# Patient Record
Sex: Male | Born: 1965 | Race: White | Hispanic: No | Marital: Married | Smoking: Never smoker
Health system: Southern US, Community
[De-identification: ages and names within clinical notes are randomized; demographics above are authoritative.]

## PROBLEM LIST (undated history)

## (undated) DIAGNOSIS — I10 Essential (primary) hypertension: Secondary | ICD-10-CM

## (undated) HISTORY — PX: KNEE ARTHROSCOPY WITH ANTERIOR CRUCIATE LIGAMENT (ACL) REPAIR: SHX5644

---

## 2005-02-21 ENCOUNTER — Ambulatory Visit: Payer: Self-pay | Admitting: General Practice

## 2006-06-02 ENCOUNTER — Ambulatory Visit: Payer: Self-pay | Admitting: Unknown Physician Specialty

## 2006-10-07 ENCOUNTER — Ambulatory Visit: Payer: Self-pay | Admitting: General Practice

## 2006-11-12 ENCOUNTER — Other Ambulatory Visit: Payer: Self-pay

## 2006-11-12 ENCOUNTER — Inpatient Hospital Stay: Payer: Self-pay | Admitting: *Deleted

## 2009-05-17 ENCOUNTER — Emergency Department: Payer: Self-pay | Admitting: Emergency Medicine

## 2009-07-14 ENCOUNTER — Ambulatory Visit: Payer: Self-pay | Admitting: Unknown Physician Specialty

## 2009-09-06 ENCOUNTER — Ambulatory Visit: Payer: Self-pay | Admitting: General Practice

## 2012-03-04 DIAGNOSIS — M7660 Achilles tendinitis, unspecified leg: Secondary | ICD-10-CM | POA: Insufficient documentation

## 2012-03-04 DIAGNOSIS — M9260 Juvenile osteochondrosis of tarsus, unspecified ankle: Secondary | ICD-10-CM | POA: Insufficient documentation

## 2012-05-19 ENCOUNTER — Ambulatory Visit: Payer: Self-pay | Admitting: General Practice

## 2012-05-19 ENCOUNTER — Emergency Department: Payer: Self-pay | Admitting: Emergency Medicine

## 2012-05-19 LAB — COMPREHENSIVE METABOLIC PANEL
Alkaline Phosphatase: 70 U/L (ref 50–136)
Anion Gap: 8 (ref 7–16)
BUN: 14 mg/dL (ref 7–18)
Bilirubin,Total: 0.2 mg/dL (ref 0.2–1.0)
Calcium, Total: 8.8 mg/dL (ref 8.5–10.1)
Chloride: 102 mmol/L (ref 98–107)
Co2: 29 mmol/L (ref 21–32)
Creatinine: 0.96 mg/dL (ref 0.60–1.30)
EGFR (African American): >60
EGFR (Non-African Amer.): >60
Potassium: 3.3 mmol/L — ABNORMAL LOW (ref 3.5–5.1)
SGOT(AST): 24 U/L (ref 15–37)
SGPT (ALT): 32 U/L (ref 12–78)
Sodium: 139 mmol/L (ref 136–145)
Total Protein: 7.3 g/dL (ref 6.4–8.2)

## 2012-05-19 LAB — CBC WITH DIFFERENTIAL/PLATELET
Basophil #: 0.1 10*3/uL (ref 0.0–0.1)
Basophil %: 0.5 %
Eosinophil #: 0.2 10*3/uL (ref 0.0–0.7)
Lymphocyte #: 3 10*3/uL (ref 1.0–3.6)
Lymphocyte %: 26.5 %
MCHC: 33.3 g/dL (ref 32.0–36.0)
MCV: 93 fL (ref 80–100)
Monocyte #: 1.1 x10 3/mm — ABNORMAL HIGH (ref 0.2–1.0)
Neutrophil #: 6.9 10*3/uL — ABNORMAL HIGH (ref 1.4–6.5)
Neutrophil %: 62 %
RBC: 4.2 10*6/uL — ABNORMAL LOW (ref 4.40–5.90)
RDW: 12.4 % (ref 11.5–14.5)

## 2012-05-24 ENCOUNTER — Other Ambulatory Visit: Payer: Self-pay | Admitting: General Practice

## 2012-05-24 LAB — PROTIME-INR
INR: 1
Prothrombin Time: 13.7 secs (ref 11.5–14.7)

## 2012-06-04 ENCOUNTER — Other Ambulatory Visit: Payer: Self-pay | Admitting: General Practice

## 2012-06-04 LAB — PROTIME-INR: INR: 2.5

## 2012-06-15 ENCOUNTER — Encounter: Payer: Self-pay | Admitting: Orthopaedic Surgery

## 2012-07-10 ENCOUNTER — Encounter: Payer: Self-pay | Admitting: Orthopaedic Surgery

## 2012-07-19 ENCOUNTER — Emergency Department: Payer: Self-pay | Admitting: Emergency Medicine

## 2012-08-09 ENCOUNTER — Encounter: Payer: Self-pay | Admitting: Orthopaedic Surgery

## 2012-09-09 ENCOUNTER — Encounter: Payer: Self-pay | Admitting: Orthopaedic Surgery

## 2013-01-07 ENCOUNTER — Ambulatory Visit: Payer: Self-pay | Admitting: Orthopaedic Surgery

## 2013-01-18 ENCOUNTER — Encounter: Payer: Self-pay | Admitting: Orthopaedic Surgery

## 2013-02-07 ENCOUNTER — Encounter: Payer: Self-pay | Admitting: Orthopaedic Surgery

## 2014-08-01 ENCOUNTER — Ambulatory Visit: Payer: Self-pay | Admitting: General Practice

## 2014-08-29 ENCOUNTER — Ambulatory Visit: Payer: Self-pay | Admitting: Gastroenterology

## 2014-12-13 ENCOUNTER — Ambulatory Visit: Admit: 2014-12-13 | Disposition: A | Discharge status: Home / Self Care | Payer: Self-pay | Admitting: General Practice

## 2015-01-02 LAB — SURGICAL PATHOLOGY

## 2015-01-05 ENCOUNTER — Other Ambulatory Visit: Payer: Self-pay | Admitting: General Practice

## 2015-01-05 DIAGNOSIS — M545 Low back pain, unspecified: Secondary | ICD-10-CM

## 2015-01-13 ENCOUNTER — Ambulatory Visit
Admission: RE | Admit: 2015-01-13 | Discharge: 2015-01-13 | Disposition: A | Discharge status: Home / Self Care | Payer: BLUE CROSS/BLUE SHIELD | Source: Ambulatory Visit | Attending: General Practice | Admitting: General Practice

## 2015-01-13 DIAGNOSIS — M545 Low back pain, unspecified: Secondary | ICD-10-CM

## 2015-01-13 DIAGNOSIS — M5127 Other intervertebral disc displacement, lumbosacral region: Secondary | ICD-10-CM | POA: Insufficient documentation

## 2015-01-13 DIAGNOSIS — M5116 Intervertebral disc disorders with radiculopathy, lumbar region: Secondary | ICD-10-CM | POA: Diagnosis not present

## 2015-07-12 ENCOUNTER — Other Ambulatory Visit: Payer: Self-pay | Admitting: Surgery

## 2015-07-12 DIAGNOSIS — M5412 Radiculopathy, cervical region: Secondary | ICD-10-CM

## 2015-07-26 ENCOUNTER — Ambulatory Visit: Payer: BLUE CROSS/BLUE SHIELD

## 2016-01-19 ENCOUNTER — Other Ambulatory Visit: Payer: Self-pay | Admitting: Family Medicine

## 2016-01-19 DIAGNOSIS — R7989 Other specified abnormal findings of blood chemistry: Secondary | ICD-10-CM

## 2016-01-19 DIAGNOSIS — R945 Abnormal results of liver function studies: Secondary | ICD-10-CM

## 2016-01-19 DIAGNOSIS — R1011 Right upper quadrant pain: Secondary | ICD-10-CM

## 2016-01-24 ENCOUNTER — Ambulatory Visit: Payer: BLUE CROSS/BLUE SHIELD

## 2016-01-29 ENCOUNTER — Ambulatory Visit
Admission: RE | Admit: 2016-01-29 | Discharge: 2016-01-29 | Disposition: A | Discharge status: Home / Self Care | Payer: BLUE CROSS/BLUE SHIELD | Source: Ambulatory Visit | Attending: Family Medicine | Admitting: Family Medicine

## 2016-01-29 DIAGNOSIS — R1011 Right upper quadrant pain: Secondary | ICD-10-CM | POA: Insufficient documentation

## 2016-01-29 DIAGNOSIS — R945 Abnormal results of liver function studies: Secondary | ICD-10-CM

## 2016-01-29 DIAGNOSIS — R932 Abnormal findings on diagnostic imaging of liver and biliary tract: Secondary | ICD-10-CM | POA: Diagnosis not present

## 2016-01-29 DIAGNOSIS — R7989 Other specified abnormal findings of blood chemistry: Secondary | ICD-10-CM | POA: Diagnosis not present

## 2017-01-17 IMAGING — US US ABDOMEN LIMITED
1 series · 14 of 25 positions shown · non-contrast
Comparison: Abdominal ultrasound 09/06/2009.

CLINICAL DATA: 50-year-old male with history of right upper
quadrant abdominal pain intermittently for the past 3 months and
elevated liver function tests.

EXAM:
US ABDOMEN LIMITED - RIGHT UPPER QUADRANT

[Series 1: us abdomen limited · 0.22mm/px · 14 of 57 slices shown]
[im 1/57]
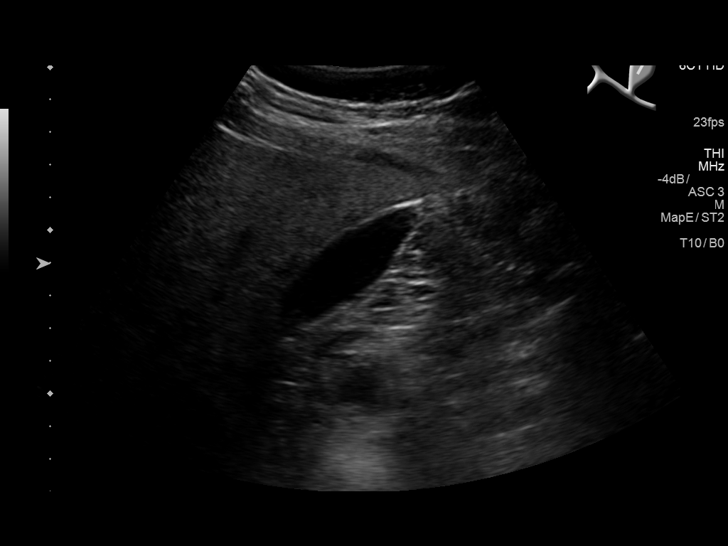
[im 5/57]
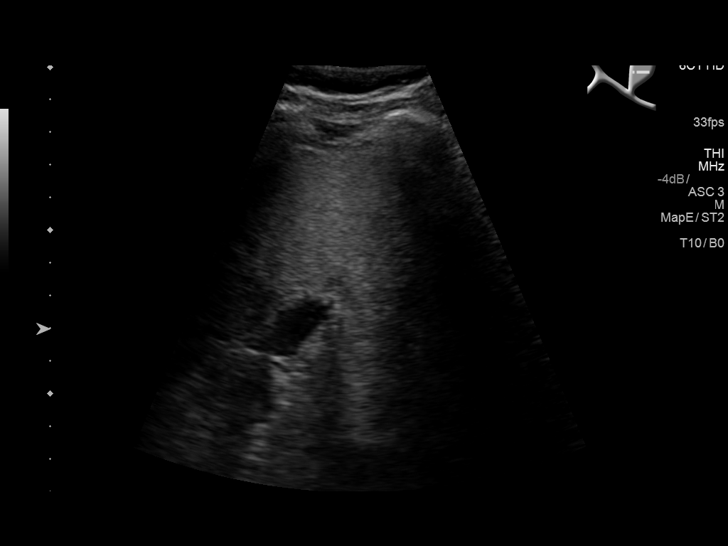
[im 10/57]
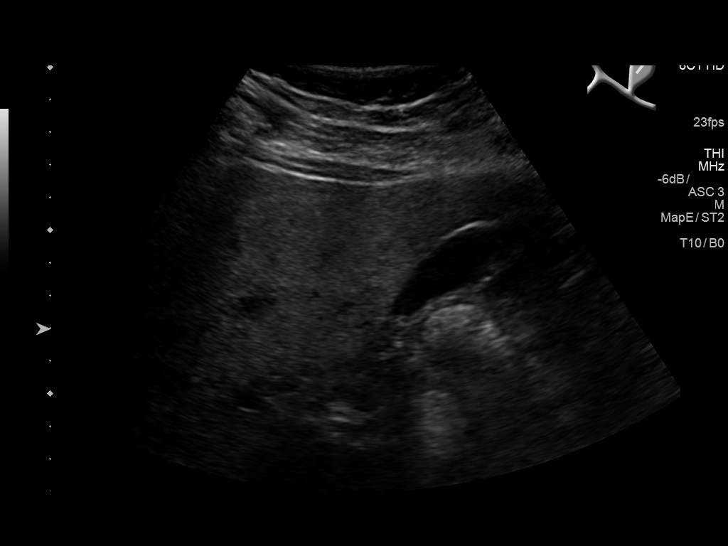
[im 15/57]
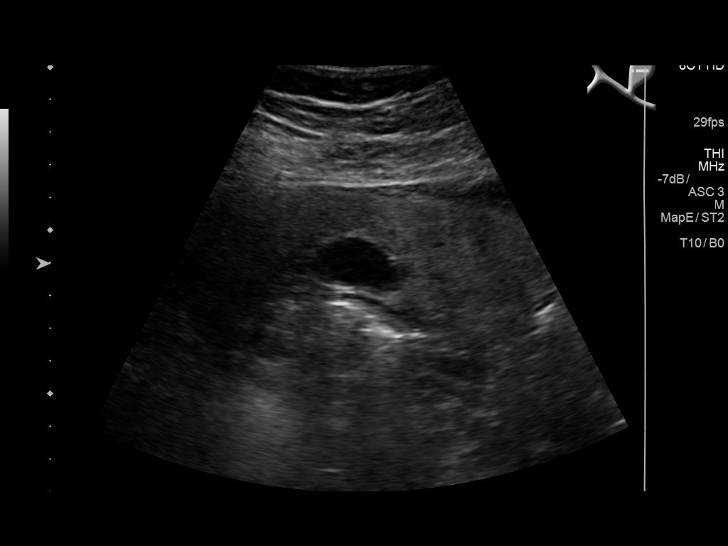
[im 19/57]
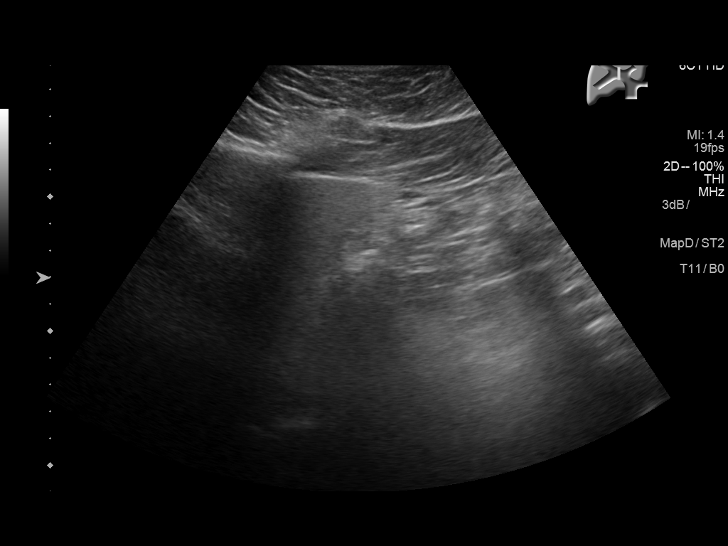
[im 22/57]
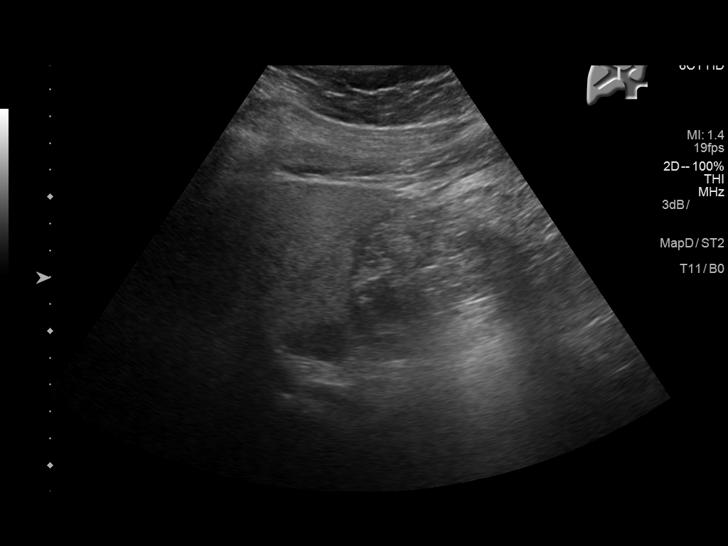
[im 26/57]
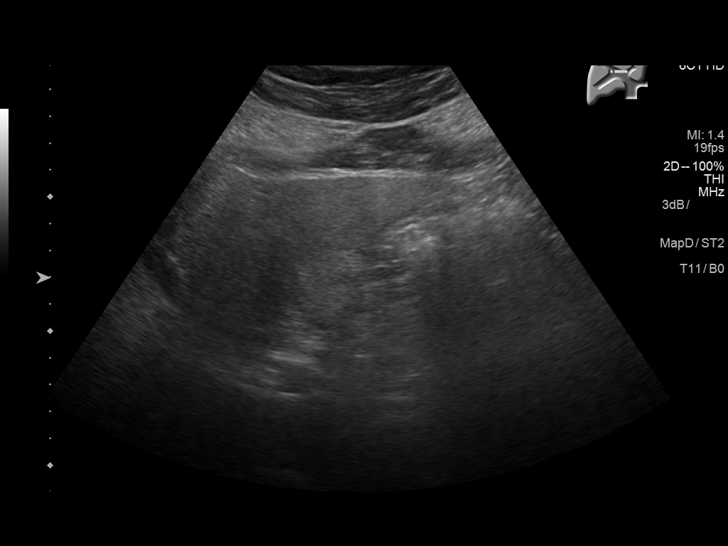
[im 31/57]
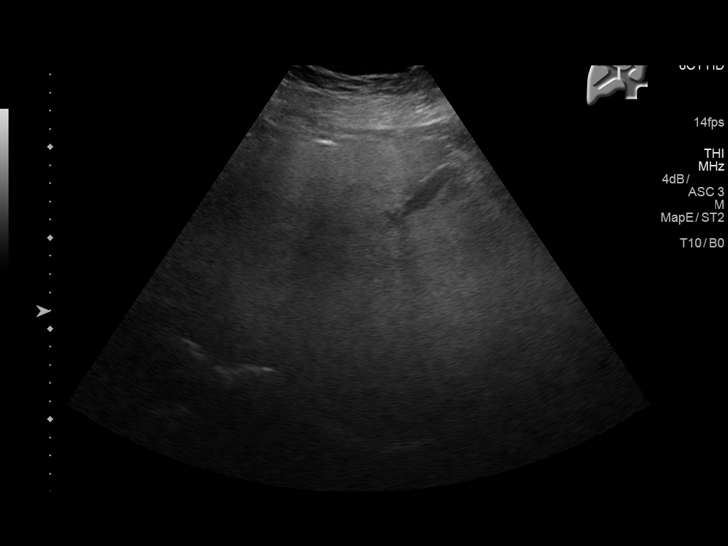
[im 36/57]
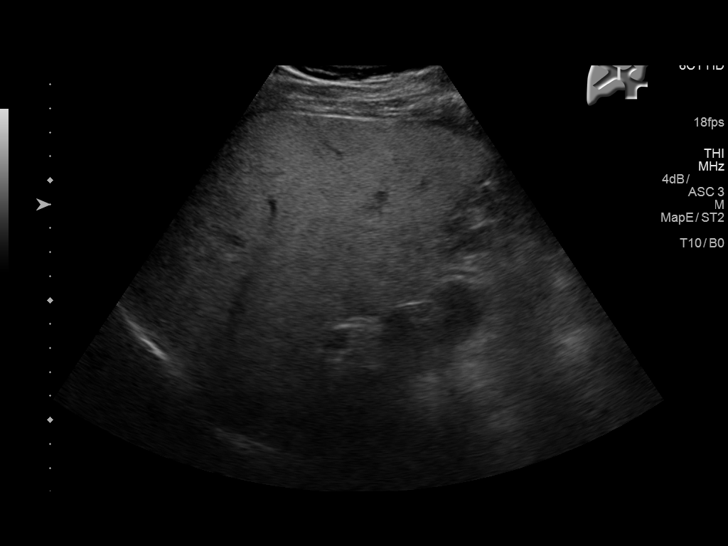
[im 38/57]
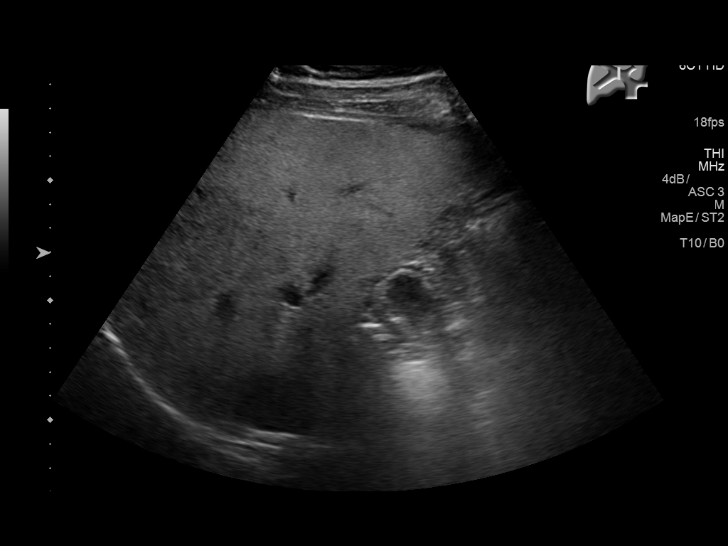
[im 43/57]
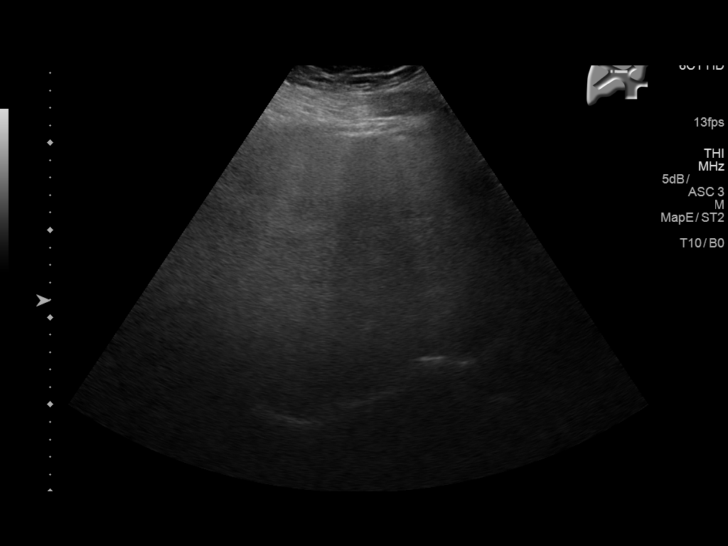
[im 47/57]
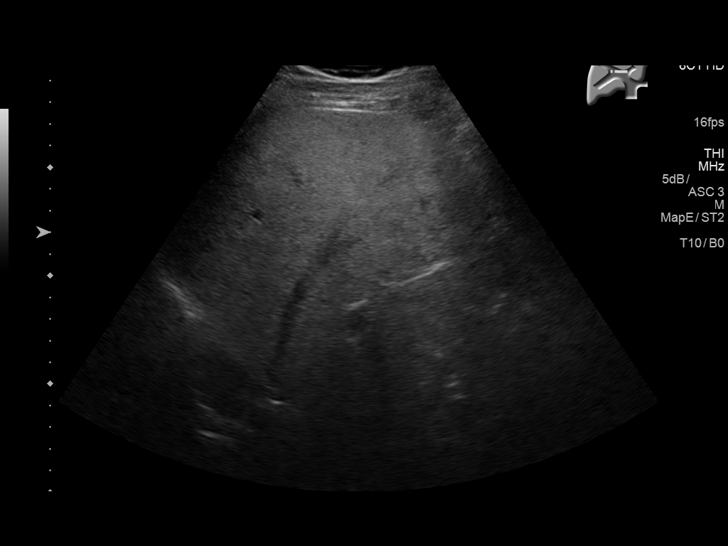
[im 52/57]
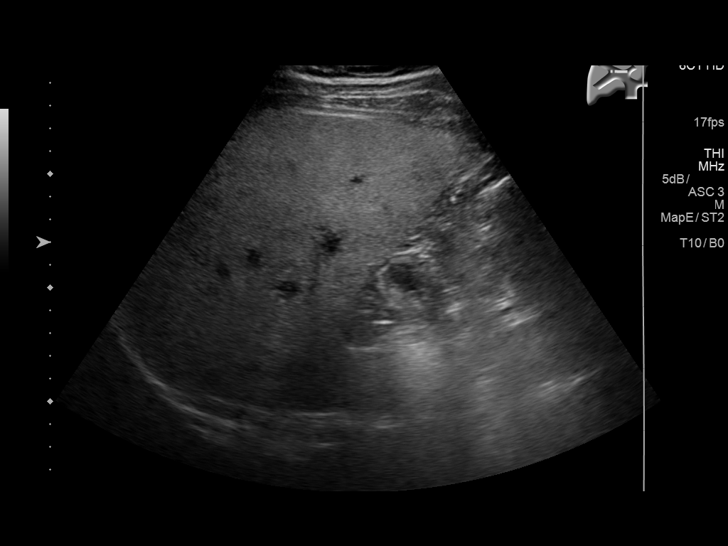
[im 57/57]
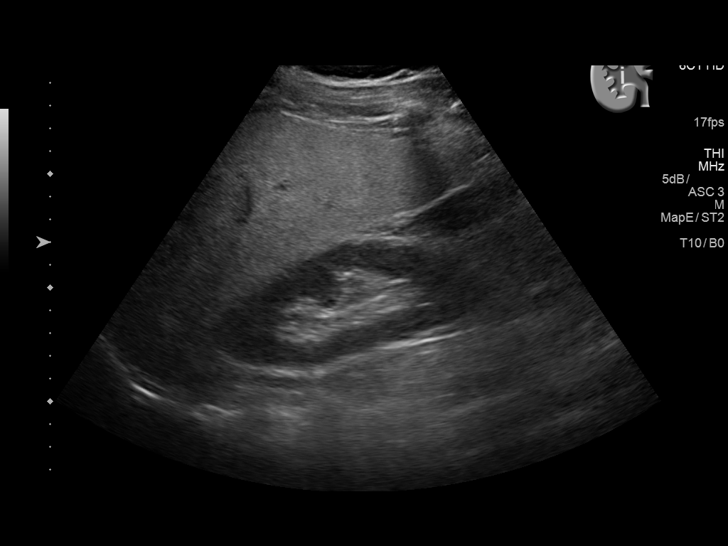

[14 of 25 positions shown; findings below may reference images not displayed]

FINDINGS: Gallbladder:

No gallstones or wall thickening visualized. No sonographic Murphy
sign noted by sonographer.

Common bile duct:

Diameter: 4.4 mm

Liver:

No focal lesion identified. Diffusely increased echogenicity,
suggestive of underlying hepatic steatosis. Normal hepatopetal flow
in the portal vein.
IMPRESSION: 1. No acute findings to account for the patient's symptoms.
Specifically, no evidence of cholelithiasis or acute cholecystitis.
2. Diffusely increased echogenicity in the liver indicative of
hepatic steatosis.

## 2017-06-04 ENCOUNTER — Other Ambulatory Visit: Payer: Self-pay | Admitting: Physician Assistant

## 2017-09-16 ENCOUNTER — Emergency Department
Admission: EM | Admit: 2017-09-16 | Discharge: 2017-09-16 | Disposition: A | Discharge status: Home / Self Care | Payer: BLUE CROSS/BLUE SHIELD | Attending: Emergency Medicine | Admitting: Emergency Medicine

## 2017-09-16 ENCOUNTER — Encounter: Payer: Self-pay | Admitting: Emergency Medicine

## 2017-09-16 DIAGNOSIS — Y828 Other medical devices associated with adverse incidents: Secondary | ICD-10-CM | POA: Insufficient documentation

## 2017-09-16 DIAGNOSIS — T8052XA Anaphylactic reaction due to vaccination, initial encounter: Secondary | ICD-10-CM | POA: Diagnosis not present

## 2017-09-16 DIAGNOSIS — R21 Rash and other nonspecific skin eruption: Secondary | ICD-10-CM | POA: Diagnosis present

## 2017-09-16 DIAGNOSIS — T782XXA Anaphylactic shock, unspecified, initial encounter: Secondary | ICD-10-CM

## 2017-09-16 DIAGNOSIS — I10 Essential (primary) hypertension: Secondary | ICD-10-CM | POA: Diagnosis not present

## 2017-09-16 HISTORY — DX: Essential (primary) hypertension: I10

## 2017-09-16 MED ORDER — FAMOTIDINE IN NACL 20-0.9 MG/50ML-% IV SOLN
20.0000 mg | Freq: Once | INTRAVENOUS | Status: AC
  Administered 2017-09-16: 20 mg via INTRAVENOUS
  Filled 2017-09-16: qty 50

## 2017-09-16 MED ORDER — METHYLPREDNISOLONE SODIUM SUCC 125 MG IJ SOLR
125.0000 mg | Freq: Once | INTRAMUSCULAR | Status: AC
Start: 2017-09-16 — End: 2017-09-16
  Administered 2017-09-16: 125 mg via INTRAVENOUS
  Filled 2017-09-16: qty 2

## 2017-09-16 MED ORDER — PREDNISONE 50 MG PO TABS: 50.0000 mg | ORAL_TABLET | Freq: Every day | ORAL | 0 refills | Status: AC

## 2017-09-16 MED ORDER — SODIUM CHLORIDE 0.9 % IV BOLUS (SEPSIS)
1000.0000 mL | Freq: Once | INTRAVENOUS | Status: AC
  Administered 2017-09-16: 1000 mL via INTRAVENOUS

## 2017-09-16 MED ORDER — EPINEPHRINE 0.3 MG/0.3ML IJ SOAJ
0.3000 mg | Freq: Once | INTRAMUSCULAR | Status: AC
  Administered 2017-09-16: 0.3 mg via INTRAMUSCULAR
  Filled 2017-09-16: qty 0.3

## 2017-09-16 MED ORDER — EPINEPHRINE 0.3 MG/0.3ML IJ SOAJ: 0.3000 mg | Freq: Once | INTRAMUSCULAR | 2 refills | Status: AC

## 2017-09-16 MED ORDER — DIPHENHYDRAMINE HCL 50 MG/ML IJ SOLN
50.0000 mg | Freq: Once | INTRAMUSCULAR | Status: AC
  Administered 2017-09-16: 50 mg via INTRAVENOUS
  Filled 2017-09-16: qty 1

## 2017-09-16 NOTE — ED Notes (Signed)
Pt denies chest pain, shortness of breath, throat swelling, and reports that itching has decreased

## 2017-09-16 NOTE — ED Provider Notes (Signed)
[facility] Emergency Department Provider Note  ____________________________________________   First MD Initiated Contact with Patient 09/16/17 1755     (approximate)  I have reviewed the triage vital signs and the nursing notes.   HISTORY  Chief Complaint Allergic Reaction   HPI Hayden Miller is a 52 y.o. male who self presents to the emergency department with an allergic reaction.  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  Earlier today he had a non-preservative cortisone injection into his right elbow and roughly 1 hour thereafter he began to develop itching rash across his chest his belt line.  He took 50 mg of Benadryl as well as ranitidine at home without improvement.  His symptoms have had insidious onset and have been slowly progressive.  He has had no chest pain or shortness of breath.  No abdominal pain nausea or vomiting.  Nothing seems to make the symptoms better.  Past Medical History:  Diagnosis Date  . Hypertension     There are no active problems to display for this patient.   History reviewed. No pertinent surgical history.  Prior to Admission medications   Medication Sig Start Date End Date Taking? Authorizing Provider  EPINEPHrine 0.3 mg/0.3 mL IJ SOAJ injection Inject 0.3 mLs (0.3 mg total) into the muscle once for 1 dose. 09/16/17 09/16/17  Merrily Brittle, MD  predniSONE (DELTASONE) 50 MG tablet Take 1 tablet (50 mg total) by mouth daily for 4 days. 09/16/17 09/20/17  Merrily Brittle, MD    Allergies Cortizone-10 [hydrocortisone]  No family history on file.  Social History Social History   Tobacco Use  . Smoking status: Never Smoker  . Smokeless tobacco: Never Used  Substance Use Topics  . Alcohol use: No    Frequency: Never  . Drug use: No    Review of Systems Constitutional: No  fever/chills Eyes: No visual changes. ENT: No sore throat. Cardiovascular: Denies chest pain. Respiratory: Denies shortness of breath. Gastrointestinal: No abdominal pain.  No nausea, no vomiting.  No diarrhea.  No constipation. Genitourinary: Negative for dysuria. Musculoskeletal: Negative for back pain. Skin: Positive for rash. Neurological: Negative for headaches, focal weakness or numbness.   ____________________________________________   PHYSICAL EXAM:  VITAL SIGNS: ED Triage Vitals  Enc Vitals Group     BP      Pulse      Resp      Temp      Temp src      SpO2      Weight      Height      Head Circumference      Peak Flow      Pain Score      Pain Loc      Pain Edu?      Excl. in GC?     Constitutional: Alert and oriented x4 well-appearing nontoxic no diaphoresis speaks in full clear sentences Eyes: PERRL EOMI. Head: Atraumatic. Nose: No congestion/rhinnorhea. Mouth/Throat: No trismus Neck: No stridor.   Cardiovascular: Normal rate, regular rhythm. Grossly normal heart sounds.  Good peripheral circulation. Respiratory: Normal respiratory effort.  No retractions. Lungs CTAB and moving good air Gastrointestinal: Soft nontender Musculoskeletal: No lower extremity edema   Neurologic:  Normal speech and language. No gross focal neurologic deficits are appreciated. Skin: Urticaria with wheals and flare across his belt line as well as his chest  and abdomen Psychiatric: Mood and affect are normal. Speech and behavior are normal.    ____________________________________________   DIFFERENTIAL includes but not limited to  Allergic reaction, anaphylaxis ____________________________________________   LABS (all labs ordered are listed, but only abnormal results are displayed)  Labs Reviewed - No data to  display   __________________________________________  EKG   ____________________________________________  RADIOLOGY   ____________________________________________   PROCEDURES  Procedure(s) performed: no  Procedures  Critical Care performed: no  Observation: no ____________________________________________   INITIAL IMPRESSION / ASSESSMENT AND PLAN / ED COURSE  Pertinent labs & imaging results that were available during my care of the patient were reviewed by me and considered in my medical decision making (see chart for details).  The patient has a significant allergic reaction although at this point I do not believe he is truly anaphylactic.  I do lengthy discussion with the patient and family at bedside including his EMT daughter and registered nurse wife regarding epinephrine versus observation.  Given his serious allergic reactions anaphylaxis in the past they have opted for epinephrine which I think is reasonable.  Solu-Medrol, Benadryl, Pepcid and fluids are pending.  After receiving epinephrine the patient's rash resolved.  He was observed for 1 hour in the emergency department and I recommended to the patient that he be observed for a total of 4 hours for biphasic reaction.  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  I think this is actually quite reasonable as they are highly educated family who understands the signs and symptoms of anaphylaxis.  He will be discharged for 4 more days of prednisone as well as 2 EpiPen's.  Strict return precautions have been given and the patient verbalizes understanding and agreement the plan.      ____________________________________________   FINAL CLINICAL IMPRESSION(S) / ED DIAGNOSES  Final diagnoses:  Anaphylaxis, initial encounter      NEW MEDICATIONS STARTED DURING  THIS VISIT:  This SmartLink is deprecated. Use AVSMEDLIST instead to display the medication list for a patient.   Note:  This document was prepared using Dragon voice recognition software and may include unintentional dictation errors.     Merrily Brittleifenbark, Morry Veiga, MD 09/16/17 2033

## 2017-09-16 NOTE — Discharge Instructions (Signed)
These take your steroids as prescribed for the next 4 days along with Benadryl and famotidine around-the-clock.  Make sure you follow-up with your primary care physician this week for allergy testing.  Return to the emergency department for any concerns.  It was a pleasure to take care of you today, and thank you for coming to our emergency department.  If you have any questions or concerns before leaving please ask the nurse to grab me and I'm more than happy to go through your aftercare instructions again.  If you were prescribed any opioid pain medication today such as Norco, Vicodin, Percocet, morphine, hydrocodone, or oxycodone please make sure you do not drive when you are taking this medication as it can alter your ability to drive safely.  If you have any concerns once you are home that you are not improving or are in fact getting worse before you can make it to your follow-up appointment, please do not hesitate to call 911 and come back for further evaluation.  Merrily BrittleNeil [place], MD

## 2017-09-16 NOTE — ED Triage Notes (Signed)
Pt in via POV with complaints of allergic reaction to cortizone shot; pt with hx of anaphylaxis in past, MD advised pt that the reaction was to preservatives in the shot and that this shot did not have any preservatives.  Pt with full body hives, denies any trouble breathing, able to speak in full clear sentences.  EDP to bedside upon arrival to room.

## 2018-01-23 DIAGNOSIS — K219 Gastro-esophageal reflux disease without esophagitis: Secondary | ICD-10-CM | POA: Insufficient documentation

## 2018-01-23 DIAGNOSIS — Z86718 Personal history of other venous thrombosis and embolism: Secondary | ICD-10-CM | POA: Insufficient documentation

## 2018-01-23 DIAGNOSIS — I10 Essential (primary) hypertension: Secondary | ICD-10-CM | POA: Insufficient documentation

## 2019-03-24 DIAGNOSIS — G4733 Obstructive sleep apnea (adult) (pediatric): Secondary | ICD-10-CM | POA: Diagnosis not present

## 2019-04-08 DIAGNOSIS — G2581 Restless legs syndrome: Secondary | ICD-10-CM | POA: Diagnosis not present

## 2019-04-08 DIAGNOSIS — I10 Essential (primary) hypertension: Secondary | ICD-10-CM | POA: Diagnosis not present

## 2019-04-08 DIAGNOSIS — G4733 Obstructive sleep apnea (adult) (pediatric): Secondary | ICD-10-CM | POA: Diagnosis not present

## 2019-04-08 DIAGNOSIS — K219 Gastro-esophageal reflux disease without esophagitis: Secondary | ICD-10-CM | POA: Diagnosis not present

## 2019-04-12 DIAGNOSIS — G4733 Obstructive sleep apnea (adult) (pediatric): Secondary | ICD-10-CM | POA: Diagnosis not present

## 2019-05-13 DIAGNOSIS — G4733 Obstructive sleep apnea (adult) (pediatric): Secondary | ICD-10-CM | POA: Diagnosis not present

## 2019-06-12 DIAGNOSIS — G4733 Obstructive sleep apnea (adult) (pediatric): Secondary | ICD-10-CM | POA: Diagnosis not present

## 2019-07-13 DIAGNOSIS — G4733 Obstructive sleep apnea (adult) (pediatric): Secondary | ICD-10-CM | POA: Diagnosis not present

## 2019-08-12 DIAGNOSIS — G4733 Obstructive sleep apnea (adult) (pediatric): Secondary | ICD-10-CM | POA: Diagnosis not present

## 2019-09-12 DIAGNOSIS — G4733 Obstructive sleep apnea (adult) (pediatric): Secondary | ICD-10-CM | POA: Diagnosis not present

## 2019-09-22 DIAGNOSIS — L821 Other seborrheic keratosis: Secondary | ICD-10-CM | POA: Diagnosis not present

## 2019-09-22 DIAGNOSIS — R202 Paresthesia of skin: Secondary | ICD-10-CM | POA: Diagnosis not present

## 2019-09-22 DIAGNOSIS — D225 Melanocytic nevi of trunk: Secondary | ICD-10-CM | POA: Diagnosis not present

## 2019-09-22 DIAGNOSIS — X32XXXA Exposure to sunlight, initial encounter: Secondary | ICD-10-CM | POA: Diagnosis not present

## 2019-09-22 DIAGNOSIS — D2262 Melanocytic nevi of left upper limb, including shoulder: Secondary | ICD-10-CM | POA: Diagnosis not present

## 2019-09-22 DIAGNOSIS — L570 Actinic keratosis: Secondary | ICD-10-CM | POA: Diagnosis not present

## 2019-09-22 DIAGNOSIS — D2261 Melanocytic nevi of right upper limb, including shoulder: Secondary | ICD-10-CM | POA: Diagnosis not present

## 2019-09-22 DIAGNOSIS — D2272 Melanocytic nevi of left lower limb, including hip: Secondary | ICD-10-CM | POA: Diagnosis not present

## 2019-09-22 DIAGNOSIS — D2271 Melanocytic nevi of right lower limb, including hip: Secondary | ICD-10-CM | POA: Diagnosis not present

## 2019-09-23 DIAGNOSIS — M2012 Hallux valgus (acquired), left foot: Secondary | ICD-10-CM | POA: Diagnosis not present

## 2019-09-23 DIAGNOSIS — M205X2 Other deformities of toe(s) (acquired), left foot: Secondary | ICD-10-CM | POA: Diagnosis not present

## 2019-09-23 DIAGNOSIS — M10072 Idiopathic gout, left ankle and foot: Secondary | ICD-10-CM | POA: Diagnosis not present

## 2019-09-23 DIAGNOSIS — M65872 Other synovitis and tenosynovitis, left ankle and foot: Secondary | ICD-10-CM | POA: Diagnosis not present

## 2019-10-13 DIAGNOSIS — M7051 Other bursitis of knee, right knee: Secondary | ICD-10-CM | POA: Diagnosis not present

## 2019-10-13 DIAGNOSIS — G4733 Obstructive sleep apnea (adult) (pediatric): Secondary | ICD-10-CM | POA: Diagnosis not present

## 2019-11-10 DIAGNOSIS — G4733 Obstructive sleep apnea (adult) (pediatric): Secondary | ICD-10-CM | POA: Diagnosis not present

## 2019-11-26 DIAGNOSIS — G4733 Obstructive sleep apnea (adult) (pediatric): Secondary | ICD-10-CM | POA: Diagnosis not present

## 2019-12-03 ENCOUNTER — Ambulatory Visit: Payer: 59 | Attending: Internal Medicine

## 2019-12-03 DIAGNOSIS — Z23 Encounter for immunization: Secondary | ICD-10-CM

## 2019-12-03 NOTE — Progress Notes (Signed)
   Covid-19 Vaccination Clinic  Name:  Hayden Miller    MRN: [id number] DOB: [birth year]  12/03/2019  Mr. [name] was observed post Covid-19 immunization for 15 minutes without incident. He was provided with Vaccine Information Sheet and instruction to access the V-Safe system.   [name] was instructed to call 911 with any severe reactions post vaccine: Marland Kitchen Difficulty breathing  . Swelling of face and throat  . A fast heartbeat  . A bad rash all over body  . Dizziness and weakness   Immunizations Administered    Name Date Dose VIS Date Route   Pfizer COVID-19 Vaccine 12/03/2019 11:25 AM 0.3 mL 08/20/2019 Intramuscular   Manufacturer: [employer], [employer]   Lot: [id number]   NDC: [address]-3

## 2019-12-11 DIAGNOSIS — G4733 Obstructive sleep apnea (adult) (pediatric): Secondary | ICD-10-CM | POA: Diagnosis not present

## 2019-12-27 DIAGNOSIS — G4733 Obstructive sleep apnea (adult) (pediatric): Secondary | ICD-10-CM | POA: Diagnosis not present

## 2019-12-28 ENCOUNTER — Ambulatory Visit: Payer: 59 | Attending: Internal Medicine

## 2019-12-28 DIAGNOSIS — Z23 Encounter for immunization: Secondary | ICD-10-CM

## 2019-12-28 NOTE — Progress Notes (Signed)
   Covid-19 Vaccination Clinic  Name:  Hayden Miller    MRN: [id number] DOB: 1965/12/15  12/28/2019  Mr. Poss was observed post Covid-19 immunization for 15 minutes without incident. He was provided with Vaccine Information Sheet and instruction to access the V-Safe system.   [name] was instructed to call 911 with any severe reactions post vaccine: Marland Kitchen Difficulty breathing  . Swelling of face and throat  . A fast heartbeat  . A bad rash all over body  . Dizziness and weakness   Immunizations Administered    Name Date Dose VIS Date Route   Pfizer COVID-19 Vaccine 12/28/2019  8:40 AM 0.3 mL 11/03/2018 Intramuscular   Manufacturer: [employer], [employer]   Lot: [id number]   NDC: [address]-7

## 2020-01-10 DIAGNOSIS — G4733 Obstructive sleep apnea (adult) (pediatric): Secondary | ICD-10-CM | POA: Diagnosis not present

## 2020-01-20 DIAGNOSIS — Z125 Encounter for screening for malignant neoplasm of prostate: Secondary | ICD-10-CM | POA: Diagnosis not present

## 2020-01-20 DIAGNOSIS — I10 Essential (primary) hypertension: Secondary | ICD-10-CM | POA: Diagnosis not present

## 2020-01-20 DIAGNOSIS — Z79899 Other long term (current) drug therapy: Secondary | ICD-10-CM | POA: Diagnosis not present

## 2020-01-26 DIAGNOSIS — G4733 Obstructive sleep apnea (adult) (pediatric): Secondary | ICD-10-CM | POA: Diagnosis not present

## 2020-01-27 DIAGNOSIS — I10 Essential (primary) hypertension: Secondary | ICD-10-CM | POA: Diagnosis not present

## 2020-01-27 DIAGNOSIS — G4733 Obstructive sleep apnea (adult) (pediatric): Secondary | ICD-10-CM | POA: Insufficient documentation

## 2020-01-27 DIAGNOSIS — Z79899 Other long term (current) drug therapy: Secondary | ICD-10-CM | POA: Diagnosis not present

## 2020-01-27 DIAGNOSIS — Z0000 Encounter for general adult medical examination without abnormal findings: Secondary | ICD-10-CM | POA: Diagnosis not present

## 2020-05-03 DIAGNOSIS — G4733 Obstructive sleep apnea (adult) (pediatric): Secondary | ICD-10-CM | POA: Diagnosis not present

## 2020-05-10 DIAGNOSIS — B001 Herpesviral vesicular dermatitis: Secondary | ICD-10-CM | POA: Diagnosis not present

## 2020-05-25 DIAGNOSIS — G4733 Obstructive sleep apnea (adult) (pediatric): Secondary | ICD-10-CM | POA: Diagnosis not present

## 2020-06-03 DIAGNOSIS — G4733 Obstructive sleep apnea (adult) (pediatric): Secondary | ICD-10-CM | POA: Diagnosis not present

## 2020-07-03 DIAGNOSIS — G4733 Obstructive sleep apnea (adult) (pediatric): Secondary | ICD-10-CM | POA: Diagnosis not present

## 2020-07-09 ENCOUNTER — Emergency Department
Admission: EM | Admit: 2020-07-09 | Discharge: 2020-07-09 | Disposition: A | Discharge status: Home / Self Care | Payer: 59 | Attending: Emergency Medicine | Admitting: Emergency Medicine

## 2020-07-09 ENCOUNTER — Emergency Department: Payer: 59

## 2020-07-09 ENCOUNTER — Other Ambulatory Visit: Payer: Self-pay

## 2020-07-09 DIAGNOSIS — I10 Essential (primary) hypertension: Secondary | ICD-10-CM | POA: Diagnosis not present

## 2020-07-09 DIAGNOSIS — M436 Torticollis: Secondary | ICD-10-CM | POA: Insufficient documentation

## 2020-07-09 DIAGNOSIS — M542 Cervicalgia: Secondary | ICD-10-CM | POA: Diagnosis not present

## 2020-07-09 MED ORDER — METHOCARBAMOL 500 MG PO TABS: ORAL_TABLET | ORAL | 0 refills | Status: AC

## 2020-07-09 MED ORDER — METHOCARBAMOL 500 MG PO TABS
1000.0000 mg | ORAL_TABLET | Freq: Once | ORAL | Status: AC
  Administered 2020-07-09: 1000 mg via ORAL
  Filled 2020-07-09: qty 2

## 2020-07-09 MED ORDER — KETOROLAC TROMETHAMINE 30 MG/ML IJ SOLN
30.0000 mg | Freq: Once | INTRAMUSCULAR | Status: AC
  Administered 2020-07-09: 30 mg via INTRAMUSCULAR
  Filled 2020-07-09: qty 1

## 2020-07-09 MED ORDER — OXYCODONE-ACETAMINOPHEN 7.5-325 MG PO TABS: 1.0000 | ORAL_TABLET | Freq: Four times a day (QID) | ORAL | 0 refills | Status: AC | PRN

## 2020-07-09 MED ORDER — ETODOLAC 400 MG PO TABS: 400.0000 mg | ORAL_TABLET | Freq: Two times a day (BID) | ORAL | 0 refills | Status: AC

## 2020-07-09 MED ORDER — OXYCODONE-ACETAMINOPHEN 7.5-325 MG PO TABS
1.0000 | ORAL_TABLET | Freq: Once | ORAL | Status: AC
  Administered 2020-07-09: 1 via ORAL
  Filled 2020-07-09: qty 1

## 2020-07-09 NOTE — ED Notes (Signed)
First Nurse Note: Pt to ED via POV c/o neck pain x 1 week. Pt ambulatory and in NAD.

## 2020-07-09 NOTE — ED Notes (Signed)
Pt ambulatory with a steady gait room from lobby.

## 2020-07-09 NOTE — ED Triage Notes (Signed)
Pt states his neck has been sore about 1 week and then when he went to get into bed last night he felt like "someone stabbed him" in his neck- pt states this morning he had difficulty getting out of bed d/t the pain

## 2020-07-09 NOTE — Discharge Instructions (Signed)
Follow-up with your primary care provider if any continued problems or concerns.  Definitely follow-up if not improving so that more imaging can be done such as an MRI.  Take medication only as directed.  Your medicine was sent to [facility].  Do not take methocarbamol and oxycodone if you are planning on driving or operating machinery as it could cause drowsiness and increase your risk for injury.  It is safe to take etodolac if you do need to drive.  Also the methocarbamol is 1 or 2 tablets every 6 hours as needed for muscle spasms.  If you feel like 1 tablet during the day is enough you may want to take 2 tablets at bedtime if you are having any neck spasms.  Use ice or heat to your neck as needed for discomfort.  Return to the emergency department if any severe worsening of your symptoms or any numbness in your arms.

## 2020-07-09 NOTE — ED Provider Notes (Signed)
[facility] Emergency Department Provider Note   ____________________________________________   First MD Initiated Contact with Patient 07/09/20 1223     (approximate)  I have reviewed the triage vital signs and the nursing notes.   HISTORY  Chief Complaint Neck Pain   HPI Hayden Miller is a 54 y.o. male presents to the ED with complaint of neck pain for approximately 1 week.  Patient states that he woke up with neck pain and difficulty moving his neck without feeling like a stabbing sensation.  He states that the left side is worse than the right.  He denies any previous neck injuries.  He denies any paresthesias.  Patient has taken over-the-counter medication without any relief.  This morning was worse when he was getting out of bed due to the pain.  Currently rates his pain as 10/10.      Past Medical History:  Diagnosis Date  . Hypertension     Patient Active Problem List   Diagnosis Date Noted  . OSA on CPAP 01/27/2020  . Benign essential hypertension 01/23/2018  . GERD without esophagitis 01/23/2018  . History of deep venous thrombosis 01/23/2018  . Achilles tendinitis 03/04/2012  . 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 KNEE ARTHROSCOPY WITH ANTERIOR CRUCIATE LIGAMENT (ACL) REPAIR      Prior to Admission medications   Medication Sig Start Date End Date Taking? Authorizing Provider  etodolac (LODINE) 400 MG tablet Take 1 tablet (400 mg total) by mouth 2 (two) times daily. 07/09/20   Tommi Rumps, PA-C  methocarbamol (ROBAXIN) 500 MG tablet 1-2 tablets every 6 hours as needed muscle spasms 07/09/20   Tommi Rumps, PA-C  oxyCODONE-acetaminophen (PERCOCET) 7.5-325 MG tablet Take 1 tablet by mouth every 6 (six) hours as needed for moderate pain or severe pain. 07/09/20 07/09/21  Tommi Rumps, PA-C    Allergies Cortizone-10 [hydrocortisone] and Codeine  No family history on  file.  Social History Social History   Tobacco Use  . Smoking status: Never Smoker  . Smokeless tobacco: Never Used  Vaping Use  . Vaping Use: Never used  Substance Use Topics  . Alcohol use: No  . Drug use: No    Review of Systems Constitutional: No fever/chills Eyes: No visual changes. ENT: No sore throat. Cardiovascular: Denies chest pain. Respiratory: Denies shortness of breath. Gastrointestinal: No abdominal pain.  No nausea, no vomiting.  No diarrhea.   Musculoskeletal: Positive cervical pain. Skin: Negative for rash. Neurological: Negative for headaches, focal weakness or numbness. ____________________________________________   PHYSICAL EXAM:  VITAL SIGNS: ED Triage Vitals  Enc Vitals Group     BP 07/09/20 1209 118/73     Pulse Rate 07/09/20 1209 72     Resp 07/09/20 1209 18     Temp 07/09/20 1209 98.3 F (36.8 C)     Temp Source 07/09/20 1209 Oral     SpO2 07/09/20 1209 98 %     Weight 07/09/20 1208 240 lb (108.9 kg)     Height 07/09/20 1208 6\' 1"  (1.854 m)     Head Circumference --      Peak Flow --      Pain Score 07/09/20 1207 10     Pain Loc --      Pain Edu? --      Excl. in GC? --     Constitutional: Alert and oriented. Well appearing and in no acute distress. Eyes: Conjunctivae  are normal. PERRL. EOMI. Head: Atraumatic. Nose: No congestion/rhinnorhea. Mouth/Throat: Mucous membranes are moist.  Neck: No stridor.  There was tenderness is noted on palpation of cervical spine posteriorly.  The left side with decreased range of motion secondary to pain.  Patient has no pain with touching his chin to his right shoulder.  No point tenderness noted on palpation of the trapezius muscles bilaterally.  Good muscle strength bilaterally.  No skin discoloration, erythema or warmth is noted. Cardiovascular: Normal rate, regular rhythm. Grossly normal heart sounds.  Good peripheral circulation. Respiratory: Normal respiratory effort.  No retractions. Lungs  CTAB. Gastrointestinal: Soft and nontender. No distention. Musculoskeletal: Moves upper and lower extremities without any difficulty.  Good muscle strength bilaterally. Neurologic:  Normal speech and language.  Reflexes 2+ bilaterally.  No gross focal neurologic deficits are appreciated. No gait instability. Skin:  Skin is warm, dry and intact. No rash noted. Psychiatric: Mood and affect are normal. Speech and behavior are normal.  ____________________________________________   LABS (all labs ordered are listed, but only abnormal results are displayed)  Labs Reviewed - No data to display  RADIOLOGY I, Tommi Rumps, personally viewed and evaluated these images (plain radiographs) as part of my medical decision making, as well as reviewing the written report by the radiologist.   Official radiology report(s): DG Cervical Spine 2-3 Views  Result Date: 07/09/2020 CLINICAL DATA:  Neck pain for about 1 week. EXAM: CERVICAL SPINE - 2-3 VIEW COMPARISON:  None. FINDINGS: There is no evidence of cervical spine fracture or prevertebral soft tissue swelling. Alignment is normal. No other significant bone abnormalities are identified. IMPRESSION: Negative cervical spine radiographs. Electronically Signed   By: Romona Curls M.D.   On: 07/09/2020 14:08    ____________________________________________   PROCEDURES  Procedure(s) performed (including Critical Care):  Procedures   ____________________________________________   INITIAL IMPRESSION / ASSESSMENT AND PLAN / ED COURSE  As part of my medical decision making, I reviewed the following data within the electronic MEDICAL RECORD NUMBER Notes from prior ED visits and [place] Controlled Substance Database  54 year old male presents to the ED with complaint of cervical pain for 1 week without history of injury.  Patient initially woke up with pain to his neck and decreased range of motion.  Patient has tried over-the-counter medications without  any relief.  There is no paresthesias or decreased muscle strength.  Range of motion to the left is less than range of motion to the right.  Patient was given Toradol 30 mg IM, methocarbamol 1000 mg p.o. and Percocet 7.5 mg prior to x-rays.  Cervical spine x-rays were negative for any acute changes.  Patient's range of motion was improved prior to discharge.  We discussed continuing with the same medications and decreasing the methocarbamol to 1 or 2 every 6 hours as needed for muscle spasms.  He is aware that he cannot drive while taking methocarbamol and oxycodone as it could cause drowsiness and increase his risk for injury.  He is encouraged to use ice or heat to his neck and to follow-up with his PCP if any continued problems or not improving.  We discussed future imaging with MRI if he is not getting any relief with these medications at home.  ____________________________________________   FINAL CLINICAL IMPRESSION(S) / ED DIAGNOSES  Final diagnoses:  Torticollis, acute     ED Discharge Orders         Ordered    methocarbamol (ROBAXIN) 500 MG tablet  07/09/20 1435    etodolac (LODINE) 400 MG tablet  2 times daily        07/09/20 1435    oxyCODONE-acetaminophen (PERCOCET) 7.5-325 MG tablet  Every 6 hours PRN        07/09/20 1435          *Please note:  Hayden Miller was evaluated in Emergency Department on 07/09/2020 for the symptoms described in the history of present illness. He was evaluated in the context of the global COVID-19 pandemic, which necessitated consideration that the patient might be at risk for infection with the SARS-CoV-2 virus that causes COVID-19. Institutional protocols and algorithms that pertain to the evaluation of patients at risk for COVID-19 are in a state of rapid change based on information released by regulatory bodies including the CDC and federal and state organizations. These policies and algorithms were followed during the patient's care in  the ED.  Some ED evaluations and interventions may be delayed as a result of limited staffing during and the pandemic.*   Note:  This document was prepared using Dragon voice recognition software and may include unintentional dictation errors.    Tommi Rumps, PA-C 07/09/20 1512    Minna Antis, MD 07/14/20 [phone]

## 2020-07-11 DIAGNOSIS — M542 Cervicalgia: Secondary | ICD-10-CM | POA: Diagnosis not present

## 2020-07-24 DIAGNOSIS — S62336A Displaced fracture of neck of fifth metacarpal bone, right hand, initial encounter for closed fracture: Secondary | ICD-10-CM | POA: Diagnosis not present

## 2020-07-31 DIAGNOSIS — S62336D Displaced fracture of neck of fifth metacarpal bone, right hand, subsequent encounter for fracture with routine healing: Secondary | ICD-10-CM | POA: Diagnosis not present

## 2020-08-15 DIAGNOSIS — S62336D Displaced fracture of neck of fifth metacarpal bone, right hand, subsequent encounter for fracture with routine healing: Secondary | ICD-10-CM | POA: Diagnosis not present

## 2020-08-24 DIAGNOSIS — S62336D Displaced fracture of neck of fifth metacarpal bone, right hand, subsequent encounter for fracture with routine healing: Secondary | ICD-10-CM | POA: Diagnosis not present

## 2020-09-05 DIAGNOSIS — S62336A Displaced fracture of neck of fifth metacarpal bone, right hand, initial encounter for closed fracture: Secondary | ICD-10-CM | POA: Diagnosis not present

## 2020-09-05 DIAGNOSIS — S62336D Displaced fracture of neck of fifth metacarpal bone, right hand, subsequent encounter for fracture with routine healing: Secondary | ICD-10-CM | POA: Diagnosis not present

## 2020-10-05 DIAGNOSIS — G4733 Obstructive sleep apnea (adult) (pediatric): Secondary | ICD-10-CM | POA: Diagnosis not present

## 2020-11-22 DIAGNOSIS — M79672 Pain in left foot: Secondary | ICD-10-CM | POA: Diagnosis not present

## 2020-11-22 DIAGNOSIS — G629 Polyneuropathy, unspecified: Secondary | ICD-10-CM | POA: Diagnosis not present

## 2020-11-22 DIAGNOSIS — M9260 Juvenile osteochondrosis of tarsus, unspecified ankle: Secondary | ICD-10-CM | POA: Diagnosis not present

## 2020-11-22 DIAGNOSIS — M7662 Achilles tendinitis, left leg: Secondary | ICD-10-CM | POA: Diagnosis not present

## 2021-01-26 DIAGNOSIS — Z79899 Other long term (current) drug therapy: Secondary | ICD-10-CM | POA: Diagnosis not present

## 2021-01-26 DIAGNOSIS — Z125 Encounter for screening for malignant neoplasm of prostate: Secondary | ICD-10-CM | POA: Diagnosis not present

## 2021-01-26 DIAGNOSIS — I10 Essential (primary) hypertension: Secondary | ICD-10-CM | POA: Diagnosis not present

## 2021-02-02 DIAGNOSIS — Z1331 Encounter for screening for depression: Secondary | ICD-10-CM | POA: Diagnosis not present

## 2021-02-02 DIAGNOSIS — I10 Essential (primary) hypertension: Secondary | ICD-10-CM | POA: Diagnosis not present

## 2021-02-02 DIAGNOSIS — Z0000 Encounter for general adult medical examination without abnormal findings: Secondary | ICD-10-CM | POA: Diagnosis not present

## 2021-02-02 DIAGNOSIS — G4733 Obstructive sleep apnea (adult) (pediatric): Secondary | ICD-10-CM | POA: Diagnosis not present

## 2021-04-03 DIAGNOSIS — G5793 Unspecified mononeuropathy of bilateral lower limbs: Secondary | ICD-10-CM | POA: Diagnosis not present

## 2021-04-03 DIAGNOSIS — M79672 Pain in left foot: Secondary | ICD-10-CM | POA: Diagnosis not present

## 2021-04-03 DIAGNOSIS — Z8739 Personal history of other diseases of the musculoskeletal system and connective tissue: Secondary | ICD-10-CM | POA: Diagnosis not present

## 2021-07-05 DIAGNOSIS — H1032 Unspecified acute conjunctivitis, left eye: Secondary | ICD-10-CM | POA: Diagnosis not present

## 2021-09-20 DIAGNOSIS — M1711 Unilateral primary osteoarthritis, right knee: Secondary | ICD-10-CM | POA: Diagnosis not present

## 2021-09-21 DIAGNOSIS — D2272 Melanocytic nevi of left lower limb, including hip: Secondary | ICD-10-CM | POA: Diagnosis not present

## 2021-09-21 DIAGNOSIS — D2262 Melanocytic nevi of left upper limb, including shoulder: Secondary | ICD-10-CM | POA: Diagnosis not present

## 2021-09-21 DIAGNOSIS — L570 Actinic keratosis: Secondary | ICD-10-CM | POA: Diagnosis not present

## 2021-09-21 DIAGNOSIS — D225 Melanocytic nevi of trunk: Secondary | ICD-10-CM | POA: Diagnosis not present

## 2021-09-21 DIAGNOSIS — D2271 Melanocytic nevi of right lower limb, including hip: Secondary | ICD-10-CM | POA: Diagnosis not present

## 2021-09-21 DIAGNOSIS — L821 Other seborrheic keratosis: Secondary | ICD-10-CM | POA: Diagnosis not present

## 2021-09-21 DIAGNOSIS — X32XXXA Exposure to sunlight, initial encounter: Secondary | ICD-10-CM | POA: Diagnosis not present

## 2021-09-21 DIAGNOSIS — D2261 Melanocytic nevi of right upper limb, including shoulder: Secondary | ICD-10-CM | POA: Diagnosis not present

## 2021-09-27 DIAGNOSIS — G4733 Obstructive sleep apnea (adult) (pediatric): Secondary | ICD-10-CM | POA: Diagnosis not present

## 2021-10-08 DIAGNOSIS — M1711 Unilateral primary osteoarthritis, right knee: Secondary | ICD-10-CM | POA: Diagnosis not present

## 2021-10-28 DIAGNOSIS — G4733 Obstructive sleep apnea (adult) (pediatric): Secondary | ICD-10-CM | POA: Diagnosis not present

## 2021-11-25 DIAGNOSIS — G4733 Obstructive sleep apnea (adult) (pediatric): Secondary | ICD-10-CM | POA: Diagnosis not present

## 2021-12-28 DIAGNOSIS — G4733 Obstructive sleep apnea (adult) (pediatric): Secondary | ICD-10-CM | POA: Diagnosis not present

## 2022-01-29 DIAGNOSIS — R5383 Other fatigue: Secondary | ICD-10-CM | POA: Diagnosis not present

## 2022-01-29 DIAGNOSIS — Z125 Encounter for screening for malignant neoplasm of prostate: Secondary | ICD-10-CM | POA: Diagnosis not present

## 2022-01-29 DIAGNOSIS — I10 Essential (primary) hypertension: Secondary | ICD-10-CM | POA: Diagnosis not present

## 2022-01-29 DIAGNOSIS — Z79899 Other long term (current) drug therapy: Secondary | ICD-10-CM | POA: Diagnosis not present

## 2022-02-05 DIAGNOSIS — Z1331 Encounter for screening for depression: Secondary | ICD-10-CM | POA: Diagnosis not present

## 2022-02-05 DIAGNOSIS — Z0000 Encounter for general adult medical examination without abnormal findings: Secondary | ICD-10-CM | POA: Diagnosis not present

## 2022-02-05 DIAGNOSIS — G4733 Obstructive sleep apnea (adult) (pediatric): Secondary | ICD-10-CM | POA: Diagnosis not present

## 2022-02-05 DIAGNOSIS — Z79899 Other long term (current) drug therapy: Secondary | ICD-10-CM | POA: Diagnosis not present

## 2022-02-05 DIAGNOSIS — I10 Essential (primary) hypertension: Secondary | ICD-10-CM | POA: Diagnosis not present

## 2022-02-05 DIAGNOSIS — Z125 Encounter for screening for malignant neoplasm of prostate: Secondary | ICD-10-CM | POA: Diagnosis not present

## 2022-02-05 DIAGNOSIS — Z9989 Dependence on other enabling machines and devices: Secondary | ICD-10-CM | POA: Diagnosis not present

## 2022-05-22 DIAGNOSIS — G4733 Obstructive sleep apnea (adult) (pediatric): Secondary | ICD-10-CM | POA: Diagnosis not present

## 2022-06-21 DIAGNOSIS — G4733 Obstructive sleep apnea (adult) (pediatric): Secondary | ICD-10-CM | POA: Diagnosis not present

## 2022-06-28 DIAGNOSIS — L03032 Cellulitis of left toe: Secondary | ICD-10-CM | POA: Diagnosis not present

## 2022-07-22 DIAGNOSIS — G4733 Obstructive sleep apnea (adult) (pediatric): Secondary | ICD-10-CM | POA: Diagnosis not present

## 2022-08-23 IMAGING — CR DG CERVICAL SPINE 2 OR 3 VIEWS
1 series · 3 of 3 positions shown · non-contrast
Comparison: None.

CLINICAL DATA: Neck pain for about 1 week.

EXAM:
CERVICAL SPINE - 2-3 VIEW

[Series 1: dg cervical spine 2 or 3 views · 0.14mm/px · 3 of 3 slices shown]
[im 1/3]
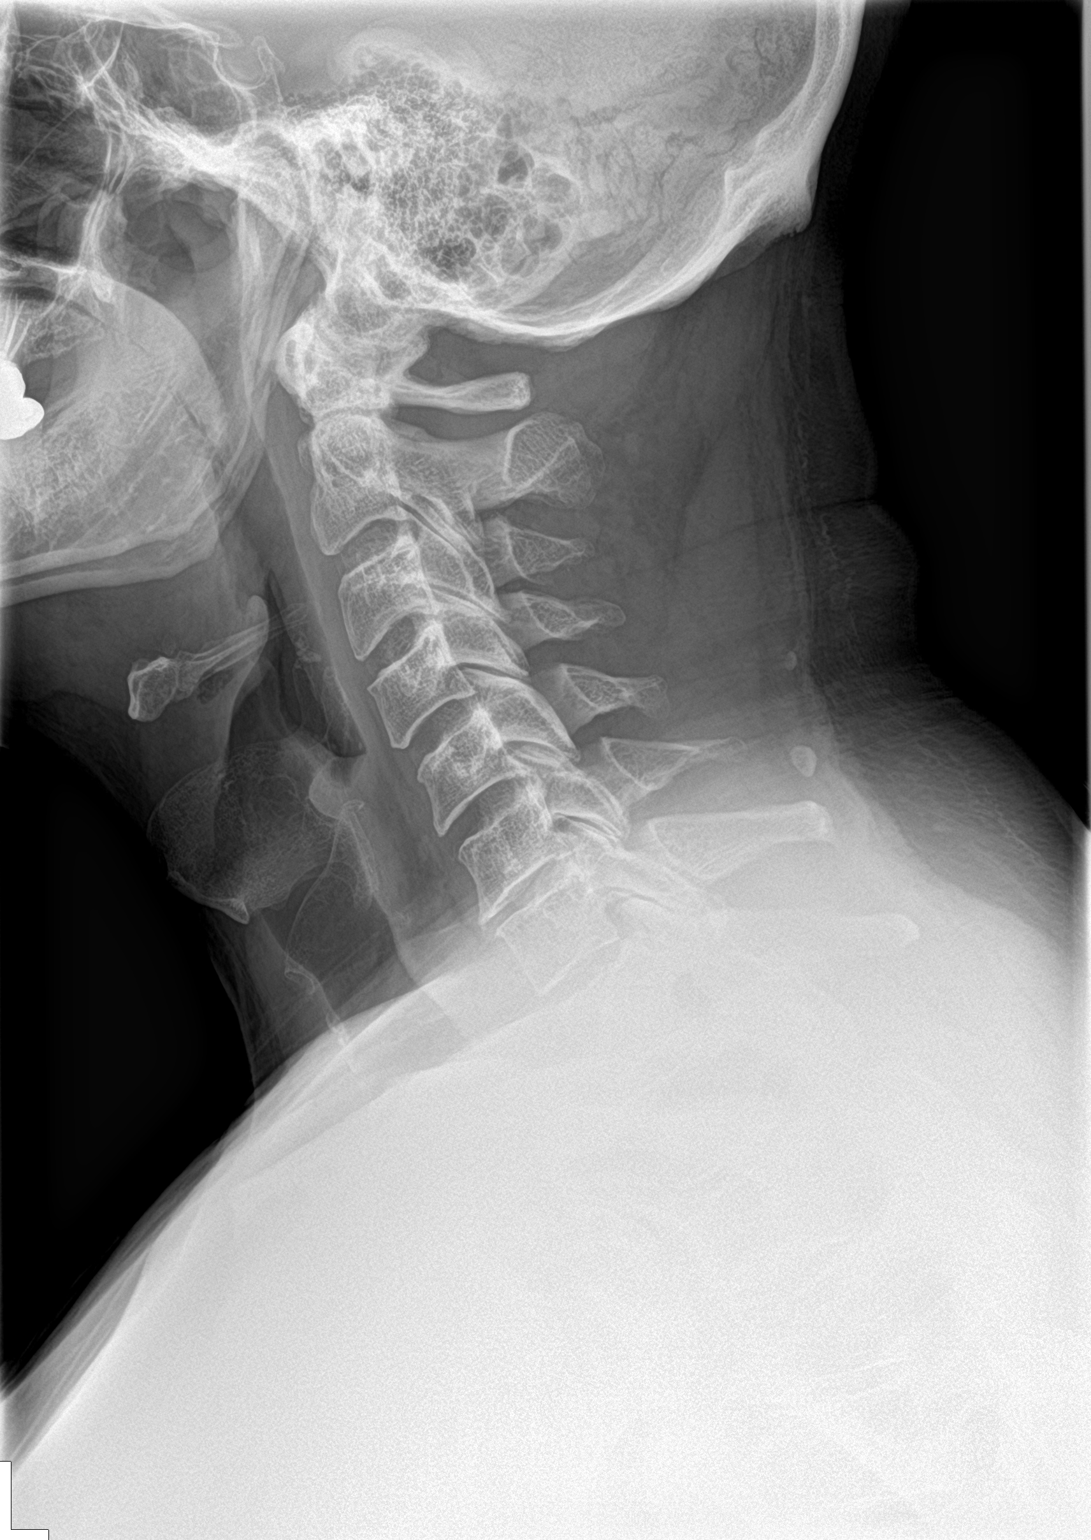
[im 2/3]
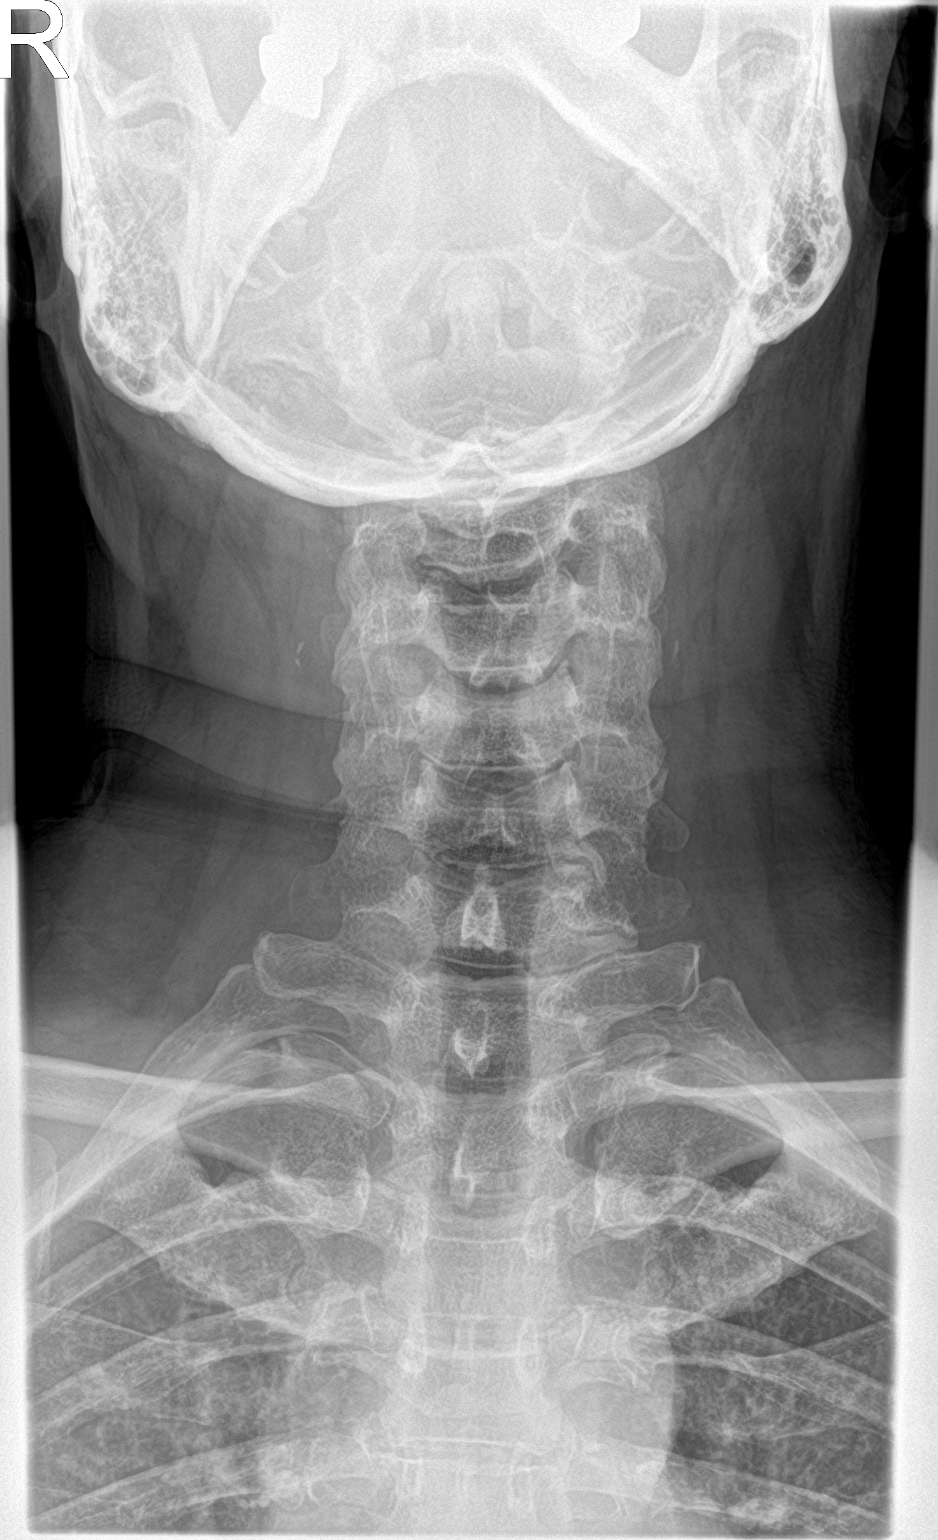
[im 3/3]
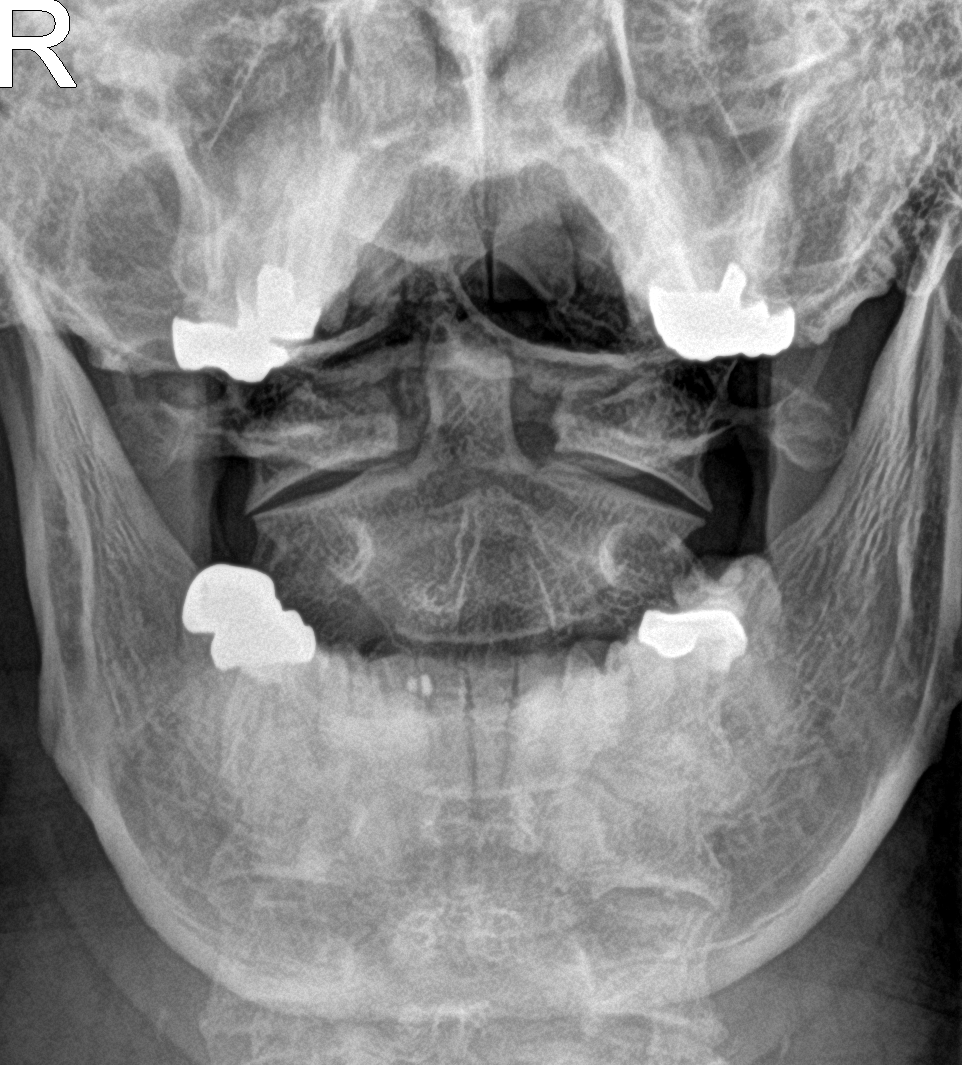

[3 of 3 positions shown; findings below may reference images not displayed]

FINDINGS: There is no evidence of cervical spine fracture or prevertebral soft
tissue swelling. Alignment is normal. No other significant bone
abnormalities are identified.
IMPRESSION: Negative cervical spine radiographs.

## 2022-11-15 DIAGNOSIS — H1032 Unspecified acute conjunctivitis, left eye: Secondary | ICD-10-CM | POA: Diagnosis not present

## 2022-12-30 DIAGNOSIS — Z0100 Encounter for examination of eyes and vision without abnormal findings: Secondary | ICD-10-CM | POA: Diagnosis not present

## 2023-01-03 DIAGNOSIS — L570 Actinic keratosis: Secondary | ICD-10-CM | POA: Diagnosis not present

## 2023-01-03 DIAGNOSIS — D2271 Melanocytic nevi of right lower limb, including hip: Secondary | ICD-10-CM | POA: Diagnosis not present

## 2023-01-03 DIAGNOSIS — D2262 Melanocytic nevi of left upper limb, including shoulder: Secondary | ICD-10-CM | POA: Diagnosis not present

## 2023-01-03 DIAGNOSIS — D225 Melanocytic nevi of trunk: Secondary | ICD-10-CM | POA: Diagnosis not present

## 2023-01-03 DIAGNOSIS — L821 Other seborrheic keratosis: Secondary | ICD-10-CM | POA: Diagnosis not present

## 2023-01-03 DIAGNOSIS — D2261 Melanocytic nevi of right upper limb, including shoulder: Secondary | ICD-10-CM | POA: Diagnosis not present

## 2023-01-03 DIAGNOSIS — D2272 Melanocytic nevi of left lower limb, including hip: Secondary | ICD-10-CM | POA: Diagnosis not present

## 2023-01-08 DIAGNOSIS — L03032 Cellulitis of left toe: Secondary | ICD-10-CM | POA: Diagnosis not present

## 2023-01-15 DIAGNOSIS — G4733 Obstructive sleep apnea (adult) (pediatric): Secondary | ICD-10-CM | POA: Diagnosis not present

## 2023-01-17 DIAGNOSIS — L03032 Cellulitis of left toe: Secondary | ICD-10-CM | POA: Diagnosis not present

## 2023-01-31 DIAGNOSIS — Z125 Encounter for screening for malignant neoplasm of prostate: Secondary | ICD-10-CM | POA: Diagnosis not present

## 2023-01-31 DIAGNOSIS — Z79899 Other long term (current) drug therapy: Secondary | ICD-10-CM | POA: Diagnosis not present

## 2023-01-31 DIAGNOSIS — Z13220 Encounter for screening for lipoid disorders: Secondary | ICD-10-CM | POA: Diagnosis not present

## 2023-02-07 DIAGNOSIS — Z1331 Encounter for screening for depression: Secondary | ICD-10-CM | POA: Diagnosis not present

## 2023-02-07 DIAGNOSIS — L84 Corns and callosities: Secondary | ICD-10-CM | POA: Diagnosis not present

## 2023-02-07 DIAGNOSIS — Z125 Encounter for screening for malignant neoplasm of prostate: Secondary | ICD-10-CM | POA: Diagnosis not present

## 2023-02-07 DIAGNOSIS — K219 Gastro-esophageal reflux disease without esophagitis: Secondary | ICD-10-CM | POA: Diagnosis not present

## 2023-02-07 DIAGNOSIS — Z79899 Other long term (current) drug therapy: Secondary | ICD-10-CM | POA: Diagnosis not present

## 2023-02-07 DIAGNOSIS — Z0000 Encounter for general adult medical examination without abnormal findings: Secondary | ICD-10-CM | POA: Diagnosis not present

## 2023-02-07 DIAGNOSIS — I10 Essential (primary) hypertension: Secondary | ICD-10-CM | POA: Diagnosis not present

## 2023-02-07 DIAGNOSIS — D649 Anemia, unspecified: Secondary | ICD-10-CM | POA: Diagnosis not present

## 2023-02-14 DIAGNOSIS — L03032 Cellulitis of left toe: Secondary | ICD-10-CM | POA: Diagnosis not present

## 2023-02-14 DIAGNOSIS — M79672 Pain in left foot: Secondary | ICD-10-CM | POA: Diagnosis not present

## 2023-02-14 DIAGNOSIS — M2012 Hallux valgus (acquired), left foot: Secondary | ICD-10-CM | POA: Diagnosis not present

## 2023-02-14 DIAGNOSIS — L97521 Non-pressure chronic ulcer of other part of left foot limited to breakdown of skin: Secondary | ICD-10-CM | POA: Diagnosis not present

## 2023-02-15 DIAGNOSIS — G4733 Obstructive sleep apnea (adult) (pediatric): Secondary | ICD-10-CM | POA: Diagnosis not present

## 2023-02-26 DIAGNOSIS — L97521 Non-pressure chronic ulcer of other part of left foot limited to breakdown of skin: Secondary | ICD-10-CM | POA: Diagnosis not present

## 2023-03-05 DIAGNOSIS — Z86010 Personal history of colonic polyps: Secondary | ICD-10-CM | POA: Diagnosis not present

## 2023-03-05 DIAGNOSIS — K219 Gastro-esophageal reflux disease without esophagitis: Secondary | ICD-10-CM | POA: Diagnosis not present

## 2023-03-17 DIAGNOSIS — G4733 Obstructive sleep apnea (adult) (pediatric): Secondary | ICD-10-CM | POA: Diagnosis not present

## 2023-03-19 DIAGNOSIS — L97521 Non-pressure chronic ulcer of other part of left foot limited to breakdown of skin: Secondary | ICD-10-CM | POA: Diagnosis not present

## 2023-04-17 ENCOUNTER — Ambulatory Visit: Payer: 59

## 2023-04-17 DIAGNOSIS — Z09 Encounter for follow-up examination after completed treatment for conditions other than malignant neoplasm: Secondary | ICD-10-CM | POA: Diagnosis not present

## 2023-04-17 DIAGNOSIS — Z86010 Personal history of colonic polyps: Secondary | ICD-10-CM | POA: Diagnosis not present

## 2023-04-17 DIAGNOSIS — K219 Gastro-esophageal reflux disease without esophagitis: Secondary | ICD-10-CM | POA: Diagnosis not present

## 2023-04-17 DIAGNOSIS — D120 Benign neoplasm of cecum: Secondary | ICD-10-CM | POA: Diagnosis not present

## 2023-04-17 DIAGNOSIS — K640 First degree hemorrhoids: Secondary | ICD-10-CM | POA: Diagnosis not present

## 2023-04-21 DIAGNOSIS — L97521 Non-pressure chronic ulcer of other part of left foot limited to breakdown of skin: Secondary | ICD-10-CM | POA: Diagnosis not present
